# Patient Record
Sex: Female | Born: 1990 | Race: Black or African American | Hispanic: No | Marital: Single | Smoking: Never smoker
Health system: Southern US, Community
[De-identification: ages and names within clinical notes are randomized; demographics above are authoritative.]

---
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
## 2018-01-28 ENCOUNTER — Emergency Department (HOSPITAL_COMMUNITY)
Admission: EM | Admit: 2018-01-28 | Discharge: 2018-01-28 | Disposition: A | Discharge status: Home / Self Care | Payer: BC Managed Care – PPO | Attending: Emergency Medicine | Admitting: Emergency Medicine

## 2018-01-28 ENCOUNTER — Encounter (HOSPITAL_COMMUNITY): Payer: Self-pay

## 2018-01-28 DIAGNOSIS — M791 Myalgia, unspecified site: Secondary | ICD-10-CM | POA: Insufficient documentation

## 2018-01-28 DIAGNOSIS — M7918 Myalgia, other site: Secondary | ICD-10-CM
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
## 2018-01-28 NOTE — Discharge Instructions (Addendum)
Take Motrin and Tylenol as needed as directed for pain. Apply warm compresses to sore muscles for 20 minutes at a time. Follow up with primary care, return to ER for worsening or concerning symptoms.

## 2018-04-02 ENCOUNTER — Ambulatory Visit (INDEPENDENT_AMBULATORY_CARE_PROVIDER_SITE_OTHER): Payer: BC Managed Care – PPO

## 2018-04-02 ENCOUNTER — Ambulatory Visit (HOSPITAL_COMMUNITY)
Admission: EM | Admit: 2018-04-02 | Discharge: 2018-04-02 | Disposition: A | Discharge status: Home / Self Care | Payer: BC Managed Care – PPO | Attending: Family Medicine | Admitting: Family Medicine

## 2018-04-02 ENCOUNTER — Encounter (HOSPITAL_COMMUNITY): Payer: Self-pay | Admitting: Emergency Medicine

## 2018-04-02 ENCOUNTER — Other Ambulatory Visit: Payer: Self-pay

## 2018-04-02 DIAGNOSIS — S60221A Contusion of right hand, initial encounter: Secondary | ICD-10-CM

## 2018-04-02 DIAGNOSIS — S0083XA Contusion of other part of head, initial encounter: Secondary | ICD-10-CM

## 2018-04-02 DIAGNOSIS — M25551 Pain in right hip: Secondary | ICD-10-CM | POA: Diagnosis not present

## 2018-04-02 MED ORDER — TIZANIDINE HCL 4 MG PO TABS: 4.0000 mg | ORAL_TABLET | Freq: Four times a day (QID) | ORAL | 0 refills | Status: AC | PRN

## 2018-04-02 MED ORDER — IBUPROFEN 800 MG PO TABS: 800.0000 mg | ORAL_TABLET | Freq: Three times a day (TID) | ORAL | 0 refills | Status: AC

## 2018-04-02 NOTE — Discharge Instructions (Addendum)
Rest today You may have increased muscle soreness tomorrow Ice to any painful areas Ibuprofen 3 times a day with food Take tizanidine as needed as a muscle relaxer.  This is useful at bedtime Follow-up if you fail to improve over the next few days, or if you develop new symptoms
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
## 2019-01-22 ENCOUNTER — Other Ambulatory Visit: Payer: Self-pay

## 2019-01-22 DIAGNOSIS — Z20822 Contact with and (suspected) exposure to covid-19: Secondary | ICD-10-CM

## 2019-01-24 LAB — NOVEL CORONAVIRUS, NAA: SARS-CoV-2, NAA: NOT DETECTED

## 2020-05-04 ENCOUNTER — Ambulatory Visit: Payer: BC Managed Care – PPO

## 2020-05-05 ENCOUNTER — Ambulatory Visit: Payer: Self-pay
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
## 2021-01-11 ENCOUNTER — Ambulatory Visit (HOSPITAL_COMMUNITY)
Admission: EM | Admit: 2021-01-11 | Discharge: 2021-01-11 | Disposition: A | Discharge status: Home / Self Care | Payer: BC Managed Care – PPO

## 2021-01-11 ENCOUNTER — Other Ambulatory Visit: Payer: Self-pay

## 2021-01-11 ENCOUNTER — Encounter (HOSPITAL_COMMUNITY): Payer: Self-pay | Admitting: Emergency Medicine

## 2021-01-11 DIAGNOSIS — L0291 Cutaneous abscess, unspecified: Secondary | ICD-10-CM

## 2021-01-11 DIAGNOSIS — N764 Abscess of vulva: Secondary | ICD-10-CM

## 2021-01-11 MED ORDER — DOXYCYCLINE HYCLATE 100 MG PO CAPS: 100.0000 mg | ORAL_CAPSULE | Freq: Two times a day (BID) | ORAL | 0 refills | Status: AC

## 2021-01-11 NOTE — ED Triage Notes (Signed)
Pt presents with abscess on right inner thigh Xs 9 days

## 2021-01-11 NOTE — Discharge Instructions (Addendum)
Take antibiotic as prescribed. Follow up with any further concerns.  

## 2021-01-11 NOTE — ED Provider Notes (Signed)
[facility]    CSN: [id number] Arrival date & time: 01/11/21  1511      History   Chief Complaint Chief Complaint  Patient presents with   Abscess    HPI Teresa Haney is a 30 y.o. female.   Patient is here today with pain and swelling to her right labia that she first noticed a few days ago.  She reports that symptoms have worsened since onset.  She had tried using warm and cool compression without significant improvement.  She has not had any fever or chills.  She denies any nausea or vomiting.  The history is provided by the patient.  Abscess Associated symptoms: no fever, no nausea and no vomiting    History reviewed. No pertinent past medical history.  There are no problems to display for this patient.   History reviewed. No pertinent surgical history.  OB History   No obstetric history on file.      Home Medications    Prior to Admission medications   Medication Sig Start Date End Date Taking? Authorizing Provider  doxycycline (VIBRAMYCIN) 100 MG capsule Take 1 capsule (100 mg total) by mouth 2 (two) times daily. 01/11/21  Yes Tomi Bamberger, PA-C  norethindrone-ethinyl estradiol (LOESTRIN) 1-20 MG-MCG tablet TAKE 1 TABLET BY MOUTH EVERY DAY. PATIENT REQUIRES YEARLY PHYSICAL PRIOR TO FURTHER REFILLS 10/01/20  Yes [provider]  ibuprofen (ADVIL,MOTRIN) 800 MG tablet Take 1 tablet (800 mg total) by mouth 3 (three) times daily. 04/02/18   Eustace Moore, MD  montelukast (SINGULAIR) 10 MG tablet Take 1 tablet (10 mg total) by mouth at bedtime. 10/12/11 10/11/12  Linna Hoff, MD  tiZANidine (ZANAFLEX) 4 MG tablet Take 1-2 tablets (4-8 mg total) by mouth every 6 (six) hours as needed for muscle spasms. 04/02/18   Eustace Moore, MD    Family History History reviewed. No pertinent family history.  Social History Social History   Tobacco Use   Smoking status: Never   Smokeless tobacco: Never  Substance Use Topics   Alcohol use:  No   Drug use: No     Allergies   Patient has no known allergies.   Review of Systems Review of Systems  Constitutional:  Negative for chills and fever.  Eyes:  Negative for discharge and redness.  Respiratory:  Negative for shortness of breath.   Gastrointestinal:  Negative for nausea and vomiting.  Skin:  Negative for rash.    Physical Exam Triage Vital Signs ED Triage Vitals  Enc Vitals Group     BP 01/11/21 1641 132/89     Pulse Rate 01/11/21 1641 (!) 108     Resp 01/11/21 1641 16     Temp 01/11/21 1641 98.9 F (37.2 C)     Temp Source 01/11/21 1641 Oral     SpO2 01/11/21 1641 100 %     Weight --      Height --      Head Circumference --      Peak Flow --      Pain Score 01/11/21 1639 8     Pain Loc --      Pain Edu? --      Excl. in GC? --    No data found.  Updated Vital Signs BP 132/89 (BP Location: Right Wrist)   Pulse (!) 108   Temp 98.9 F (37.2 C) (Oral)   Resp 16   LMP 12/27/2020   SpO2 100%  Physical Exam Vitals and nursing note reviewed.  Constitutional:      General: She is not in acute distress.    Appearance: Normal appearance. She is not ill-appearing.  HENT:     Head: Normocephalic and atraumatic.     Nose: Nose normal.  Eyes:     Conjunctiva/sclera: Conjunctivae normal.  Cardiovascular:     Rate and Rhythm: Normal rate.  Pulmonary:     Effort: Pulmonary effort is normal.  Genitourinary:    Comments: Mild swelling noted to the right labia diffusely, with few pustular lesions noted. Neurological:     Mental Status: She is alert.  Psychiatric:        Mood and Affect: Mood normal.        Behavior: Behavior normal.        Thought Content: Thought content normal.     UC Treatments / Results  Labs (all labs ordered are listed, but only abnormal results are displayed) Labs Reviewed - No data to display  EKG   Radiology No results found.  Procedures Procedures (including critical care time)  Medications Ordered  in UC Medications - No data to display  Initial Impression / Assessment and Plan / UC Course  I have reviewed the triage vital signs and the nursing notes.  Pertinent labs & imaging results that were available during my care of the patient were reviewed by me and considered in my medical decision making (see chart for details).  Given multiple pustular lesions as well as diffuse swelling of the labia will treat with oral antibiotic therapy.  I do not feel patient was a great candidate for I&D given presentation.  Encouraged warm sits baths versus compresses to help promote spontaneous drainage.  Encouraged follow-up if no improvement with treatment.  Final Clinical Impressions(s) / UC Diagnoses   Final diagnoses:  Abscess     Discharge Instructions      Take antibiotic as prescribed. Follow up with any further concerns.      ED Prescriptions     Medication Sig Dispense Auth. Provider   doxycycline (VIBRAMYCIN) 100 MG capsule Take 1 capsule (100 mg total) by mouth 2 (two) times daily. 20 capsule Tomi Bamberger, PA-C      PDMP not reviewed this encounter.   Tomi Bamberger, PA-C 01/11/21 1722
